# Patient Record
Sex: Female | Born: 1975 | Race: White | Hispanic: Yes | Marital: Single | Smoking: Never smoker
Health system: Southern US, Community
[De-identification: ages and names within clinical notes are randomized; demographics above are authoritative.]

## PROBLEM LIST (undated history)

## (undated) DIAGNOSIS — F419 Anxiety disorder, unspecified: Secondary | ICD-10-CM

## (undated) HISTORY — PX: NO PAST SURGERIES: SHX2092

## (undated) HISTORY — DX: Anxiety disorder, unspecified: F41.9

---

## 2005-10-12 ENCOUNTER — Ambulatory Visit: Payer: Self-pay | Admitting: *Deleted

## 2005-10-26 ENCOUNTER — Ambulatory Visit: Payer: Self-pay | Admitting: *Deleted

## 2005-11-30 ENCOUNTER — Ambulatory Visit: Payer: Self-pay | Admitting: *Deleted

## 2005-12-14 ENCOUNTER — Ambulatory Visit: Payer: Self-pay | Admitting: Family Medicine

## 2005-12-28 ENCOUNTER — Ambulatory Visit: Payer: Self-pay | Admitting: *Deleted

## 2006-01-04 ENCOUNTER — Ambulatory Visit: Payer: Self-pay | Admitting: Family Medicine

## 2006-01-12 ENCOUNTER — Ambulatory Visit: Payer: Self-pay | Admitting: Obstetrics & Gynecology

## 2006-01-19 ENCOUNTER — Ambulatory Visit: Payer: Self-pay | Admitting: Obstetrics & Gynecology

## 2006-01-23 ENCOUNTER — Inpatient Hospital Stay (HOSPITAL_COMMUNITY): Admission: AD | Admit: 2006-01-23 | Discharge: 2006-01-25 | Payer: Self-pay | Admitting: Gynecology

## 2006-01-23 ENCOUNTER — Ambulatory Visit: Payer: Self-pay | Admitting: Obstetrics and Gynecology

## 2006-06-09 ENCOUNTER — Ambulatory Visit: Payer: Self-pay | Admitting: Family Medicine

## 2006-07-21 ENCOUNTER — Ambulatory Visit: Payer: Self-pay | Admitting: Family Medicine

## 2007-05-25 ENCOUNTER — Ambulatory Visit: Payer: Self-pay | Admitting: Internal Medicine

## 2007-05-26 ENCOUNTER — Ambulatory Visit: Payer: Self-pay | Admitting: *Deleted

## 2007-07-26 ENCOUNTER — Encounter (INDEPENDENT_AMBULATORY_CARE_PROVIDER_SITE_OTHER): Payer: Self-pay | Admitting: *Deleted

## 2014-08-31 ENCOUNTER — Ambulatory Visit (INDEPENDENT_AMBULATORY_CARE_PROVIDER_SITE_OTHER): Payer: Self-pay | Admitting: Family Medicine

## 2014-08-31 VITALS — BP 114/68 | HR 88 | Temp 98.3°F | Resp 16 | Ht 59.75 in | Wt 119.8 lb

## 2014-08-31 DIAGNOSIS — N390 Urinary tract infection, site not specified: Secondary | ICD-10-CM

## 2014-08-31 DIAGNOSIS — Z3481 Encounter for supervision of other normal pregnancy, first trimester: Secondary | ICD-10-CM

## 2014-08-31 DIAGNOSIS — R109 Unspecified abdominal pain: Secondary | ICD-10-CM

## 2014-08-31 DIAGNOSIS — R8281 Pyuria: Secondary | ICD-10-CM

## 2014-08-31 DIAGNOSIS — Z3200 Encounter for pregnancy test, result unknown: Secondary | ICD-10-CM

## 2014-08-31 DIAGNOSIS — Z3201 Encounter for pregnancy test, result positive: Secondary | ICD-10-CM

## 2014-08-31 LAB — POCT URINALYSIS DIPSTICK
Bilirubin, UA: NEGATIVE
Blood, UA: NEGATIVE
Glucose, UA: NEGATIVE
Ketones, UA: NEGATIVE
Nitrite, UA: NEGATIVE
Protein, UA: NEGATIVE
Spec Grav, UA: 1.01
Urobilinogen, UA: 0.2
pH, UA: 6.5

## 2014-08-31 LAB — POCT URINE PREGNANCY: Preg Test, Ur: POSITIVE

## 2014-08-31 MED ORDER — NITROFURANTOIN MONOHYD MACRO 100 MG PO CAPS: 100.0000 mg | ORAL_CAPSULE | Freq: Two times a day (BID) | ORAL | Status: AC

## 2014-08-31 NOTE — Patient Instructions (Signed)
Embarazo e infección del tracto urinario °(Pregnancy and Urinary Tract Infection) °Una infección urinaria (IU) puede ocurrir en cualquier lugar del tracto urinario. La infección urinaria puede comprometer los utéteres, los riñones (pielonefritis), la vejiga (cistitis) y la uretra (uretritis). Todas las mujeres embarazadas deben ser estudiadas para diagnosticar la presencia de bacterias en el tracto urinario. La identificación y el tratamiento de una infección urinaria disminuye el riesgo de un parto prematuro y de desarrollar infecciones más graves en la madre y el bebé. °CAUSAS °Las bacterias causan casi todas las infecciones urinarias.  °FACTORES DE RIESGO °Hay muchos factores que pueden aumentar sus probabilidades de contraer una infección urinaria (IU) durante el embarazo. Pueden ser: °· Tener una uretra corta. °· Falta de aseo y malos hábitos de higiene. °· Las relaciones sexuales. °· Obstrucción de la orina en el tracto urinario. °· Problemas con los músculos o nervios pélvicos. °· Diabetes. °· Obesidad. °· Problemas en la vejiga después de tener varios hijos. °· Antecedentes de infección urinaria. °SIGNOS Y SÍNTOMAS  °· Dolor, ardor o sensación de ardor al orinar. °· Sentir la necesidad de orinar de inmediato (urgencia). °· Pérdida del control vesical (incontinencia urinaria). °· Orinar con más frecuencia de lo común en el embarazo. °· Malestar en la zona inferior del abdomen o en la espalda. °· Orina turbia. °· Sangre en la orina (hematuria). °· Fiebre.  °Cuando se infectan los riñones, los síntomas pueden ser: °· Dolor de espalda. °· Dolor lateral en el lado derecho más que en el lado izquierdo. °· Fiebre. °· Escalofríos. °· Náuseas. °· Vómitos. °DIAGNÓSTICO  °Una infección del tracto urinario se suele diagnosticar a través de la orina. A veces se realizan pruebas y procedimientos adicionales. Estos pueden ser: °· Ecografía de los riñones, los uréteres, la vejiga y la uretra. °· Observar la vejiga con un  tubo que ilumina (cistoscopía). °TRATAMIENTO °Por lo general, las IU pueden tratarse con medicamentos antibióticos.  °INSTRUCCIONES PARA EL CUIDADO EN EL HOGAR  °· Tome sólo medicamentos de venta libre o recetados, según las indicaciones del médico. Si le han recetado antibióticos, tómelos según las indicaciones. Tómelos todos, aunque se sienta mejor. °· Beba suficiente líquido para mantener la orina clara o de color amarillo pálido. °· No tenga relaciones sexuales hasta que la infección haya desaparecido o el médico la autorice. °· Asegúrese de que le hagan estudios para detectar una infección urinaria durante el embarazo. Estas infecciones suelen reaparecer.  °Para prevenir una infección urinaria en el futuro °· Practique buenos hábitos higiénicos. Siempre debe limpiarse desde adelante hacia atrás. Use el tissue sólo una vez. °· No retenga la orina. Orine tan pronto como sea posible cuando tenga ganas. °· No se haga duchas vaginales ni use desodorantes en aerosol. °· Lave con agua tibia y jabón alrededor de la zona genital y el ano. °· Vacíe la vejiga antes y después de tener relaciones sexuales. °· Use ropa interior con algodón en la entrepierna. °· Evite la cafeína y las bebidas gaseosas. Estas sustancias irritan la vejiga. °· Beba jugo de arándanos o tome comprimidos de arándano. Esto puede disminuir el riesgo de sufrir una infección urinaria. °· No beba alcohol. °· Cumpla con las visitas de control y hágase todos los análisis según lo programado.  °SOLICITE ATENCIÓN MÉDICA SI:  °· Los síntomas empeoran. °· Tiene fiebre aún después de 2 días de iniciado el tratamiento. °· Tiene una erupción. °· Siente que usted tiene problemas con los medicamentos recetados. °· Tiene flujo vaginal anormal. °SOLICITE ATENCIÓN MÉDICA DE INMEDIATO SI:  °·   Siente dolor en la espalda o a los lados. °· Tiene escalofríos. °· Observa sangre en la orina. °· Tiene náuseas o vómitos. °· Siente contracciones en el útero. °· Tiene una  perdida de líquido en chorro por la vagina. °ASEGÚRESE DE QUE: °· Comprende estas instrucciones.   °· Controlará su afección.   °· Recibirá ayuda de inmediato si no mejora o si empeora.   °Document Released: 07/19/2012 Document Revised: 08/15/2013 °ExitCare® Patient Information ©2015 [employer]. This information is not intended to replace advice given to you by your health care provider. Make sure you discuss any questions you have with your health care provider. ° °

## 2014-08-31 NOTE — Progress Notes (Signed)
Patient ID: Sally SkeansMaria D Phillips MRN: [id number], DOB: 07-16-1976, 38 y.o. Date of Encounter: 08/31/2014, 4:13 PM  This chart was scribed for Dr. Elvina SidleKurt Emma Birchler, MD by Jarvis Morganaylor Ferguson, Medical Scribe. This patient was seen in Room 2 and the patient's care was started at 4:15 PM.  Primary Physician: No primary provider on file.  Chief Complaint:  Chief Complaint  Patient presents with  . Pressure    Pt. feels pressure in her throat and chest. Has had the pressure for several months.   . Headache    2 days, Pt. states its more pressure than it is pain  . Back Pain  . Possible Pregnancy    Patient thinks she might be pregnant     HPI: 38 y.o. year old female with history below presents with gradually worsening trouble swallowing for several months. She notes it has gotten worse in the last week. Pt states that she has a sticky white phlegm and pressure feeling that has been making it difficult for her to swallow. She is having associated chest tightness and HAs. She states the HA feels more like pressure than it is pain. She denies any cough or shortness of breath.   Pt also thinks she may be pregnant. She was having associated pelvic cramps and back pain. Denies any vaginal bleeding. LNMP was 07/06/14.  Pt has been having a lot of increased anxiety in the past couple of months. She has a lot of responsibility of taking care of her family and children. She has been having associated panic attacks and trouble sleeping  Past Medical History  Diagnosis Date  . Anxiety      Home Meds: Prior to Admission medications   Not on File    Allergies: No Known Allergies  History   Social History  . Marital Status: Single    Spouse Name: N/A    Number of Children: N/A  . Years of Education: N/A   Occupational History  . Not on file.   Social History Main Topics  . Smoking status: Never Smoker   . Smokeless tobacco: Not on file  . Alcohol Use: No  . Drug Use: No  . Sexual Activity: Not  on file   Other Topics Concern  . Not on file   Social History Narrative  . No narrative on file     Review of Systems: Constitutional: negative for chills, fever, night sweats, weight changes, or fatigue  HEENT: positive for trouble swallowing. Negative for vision changes, hearing loss,  rhinorrhea, ST, epistaxis, congestion and sinus pressure Cardiovascular: negative for chest pain or palpitations Respiratory: Positive for chest tightness. negative for hemoptysis, wheezing, shortness of breath, or cough Abdominal: negative for abdominal pain, nausea, vomiting, diarrhea, or constipation Dermatological: negative for rash Neurologic: positive for headache. negative for dizziness, or syncope Musk: Positive for back pain Psychological: positive for anxiety and trouble sleeping GU: positive for pelvic pain and amenorrhea. Negative for vaginal bleeding All other systems reviewed and are otherwise negative with the exception to those above and in the HPI.   Physical Exam: Blood pressure 114/68, pulse 88, temperature 98.3 F (36.8 C), temperature source Oral, resp. rate 16, height 4' 11.75" (1.518 m), weight 119 lb 12.8 oz (54.341 kg), last menstrual period 07/05/2014, SpO2 99.00%., Body mass index is 23.58 kg/(m^2). General: Well developed, well nourished, in no acute distress. Head: Normocephalic, atraumatic, eyes without discharge, sclera non-icteric, nares are without discharge. Bilateral auditory canals clear, TM's are without perforation, pearly grey and  translucent with reflective cone of light bilaterally. Oral cavity moist, posterior pharynx without exudate, erythema, peritonsillar abscess, or post nasal drip.  Neck: Supple. No thyromegaly. Full ROM. No lymphadenopathy. Lungs: Clear bilaterally to auscultation without wheezes, rales, or rhonchi. Breathing is unlabored. Heart: RRR with S1 S2. No murmurs, rubs, or gallops appreciated. Abdomen: Soft, non-tender, non-distended with  normoactive bowel sounds. No hepatomegaly. No rebound/guarding. No obvious abdominal masses. Msk:  Strength and tone normal for age. Extremities/Skin: Warm and dry. No clubbing or cyanosis. No edema. No rashes or suspicious lesions. Neuro: Alert and oriented X 3. Moves all extremities spontaneously. Gait is normal. CNII-XII grossly in tact. Psych:  Responds to questions appropriately with a normal affect.   Labs: Results for orders placed in visit on 08/31/14  POCT URINE PREGNANCY      Result Value Ref Range   Preg Test, Ur Positive       ASSESSMENT AND PLAN:  38 y.o. year old female with pyuria, positive pregnancy test.   Possible pregnancy - Plan: POCT urine pregnancy, PR PRENATAL VITAMINS 30 DAY  Abdominal cramps - Plan: POCT urinalysis dipstick, Urine culture  Pyuria - Plan: Urine culture, nitrofurantoin, macrocrystal-monohydrate, (MACROBID) 100 MG capsule  Encounter for supervision of other normal pregnancy in first trimester - Plan: Ambulatory referral to Obstetrics / Gynecology    Signed, Elvina SidleKurt Atalia Litzinger, MD 08/31/2014 4:13 PM

## 2014-09-02 LAB — URINE CULTURE: Colony Count: 30000

## 2014-10-21 ENCOUNTER — Other Ambulatory Visit (HOSPITAL_COMMUNITY): Payer: Self-pay | Admitting: Nurse Practitioner

## 2014-10-21 DIAGNOSIS — Z3689 Encounter for other specified antenatal screening: Secondary | ICD-10-CM

## 2014-10-21 DIAGNOSIS — O09522 Supervision of elderly multigravida, second trimester: Secondary | ICD-10-CM

## 2014-10-24 ENCOUNTER — Encounter (HOSPITAL_COMMUNITY): Payer: Self-pay

## 2014-10-24 ENCOUNTER — Ambulatory Visit (HOSPITAL_COMMUNITY)
Admission: RE | Admit: 2014-10-24 | Discharge: 2014-10-24 | Disposition: A | Discharge status: Home / Self Care | Payer: Medicaid Other | Source: Ambulatory Visit | Attending: Nurse Practitioner | Admitting: Nurse Practitioner

## 2014-10-24 DIAGNOSIS — Z3A20 20 weeks gestation of pregnancy: Secondary | ICD-10-CM | POA: Insufficient documentation

## 2014-10-24 DIAGNOSIS — O09522 Supervision of elderly multigravida, second trimester: Secondary | ICD-10-CM | POA: Insufficient documentation

## 2014-10-24 DIAGNOSIS — Z36 Encounter for antenatal screening of mother: Secondary | ICD-10-CM | POA: Insufficient documentation

## 2014-10-24 DIAGNOSIS — Z3689 Encounter for other specified antenatal screening: Secondary | ICD-10-CM

## 2014-10-24 IMAGING — US US OB DETAIL+14 WK
1 series · 12 of 28 positions shown · non-contrast
Comparison: none

[Series 1: us ob detail +14 wk · 12 of 111 slices shown]
[im 5/111]
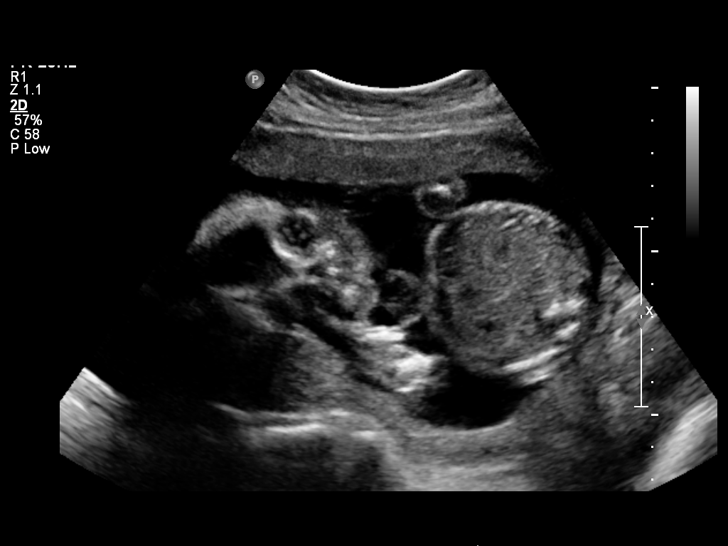
[im 13/111]
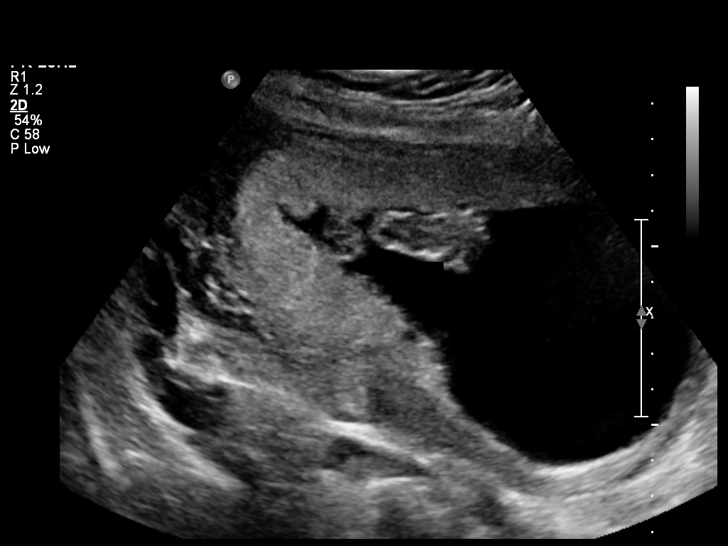
[im 21/111]
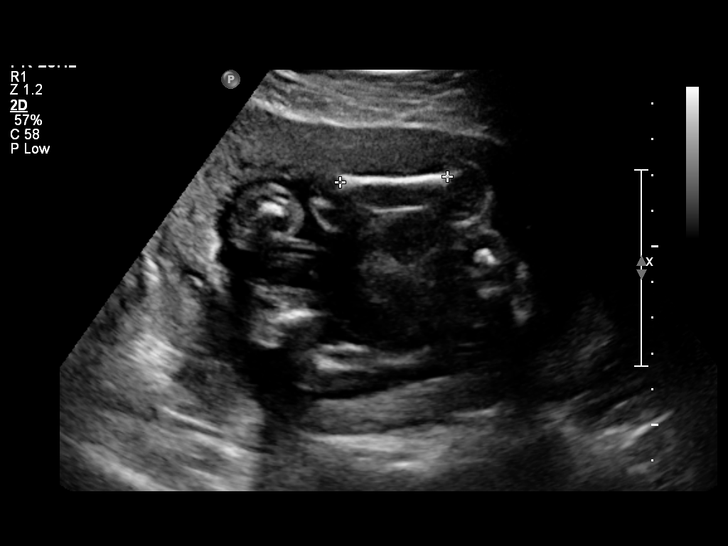
[im 33/111]
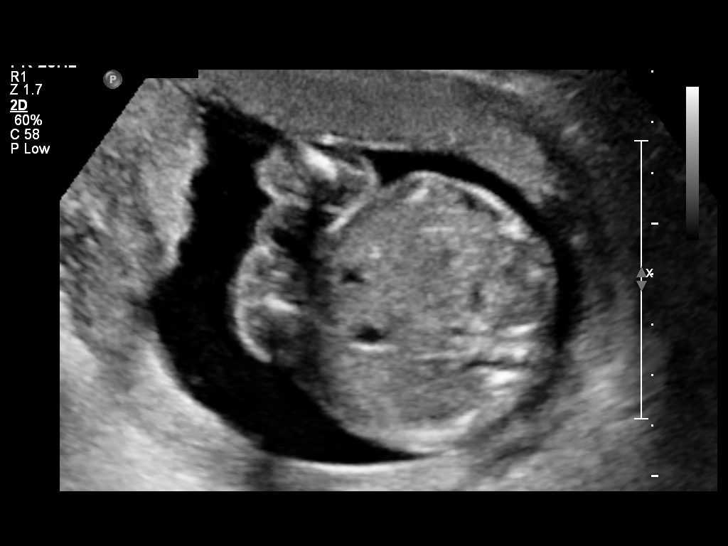
[im 41/111]
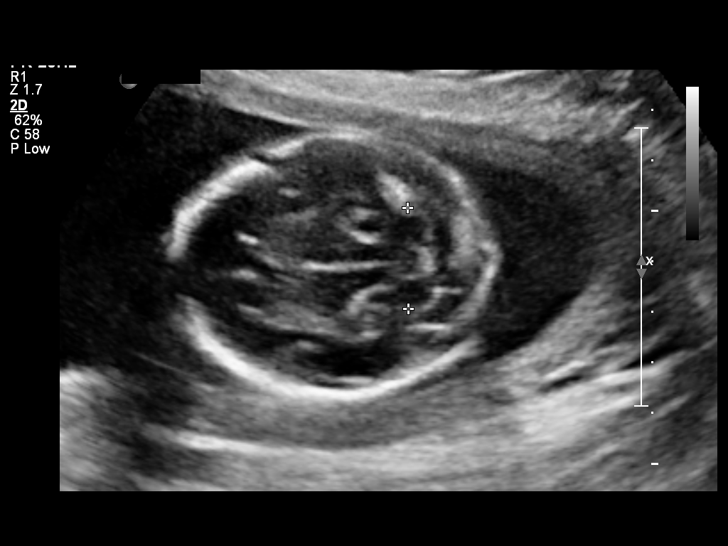
[im 49/111]
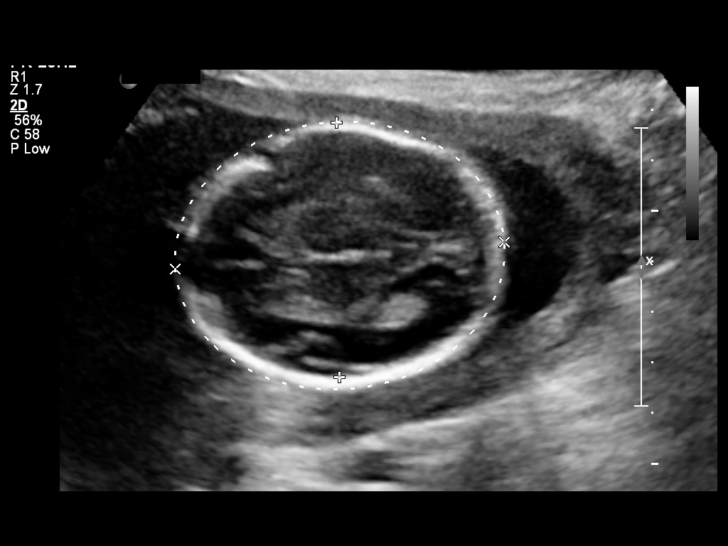
[im 62/111]
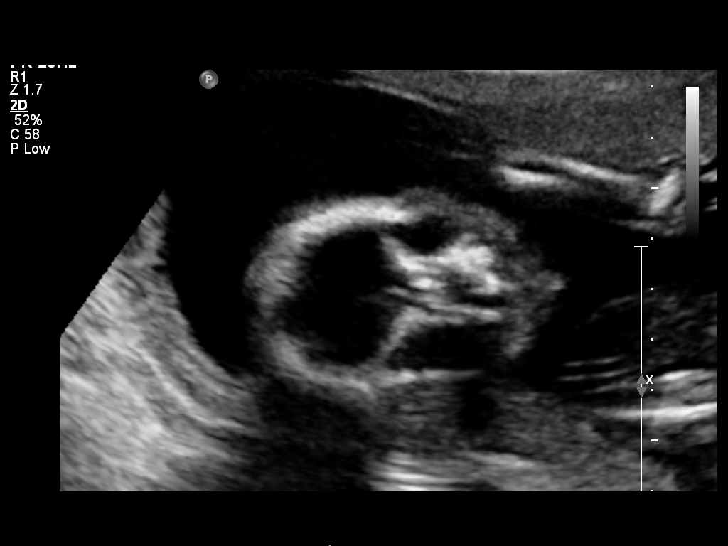
[im 70/111]
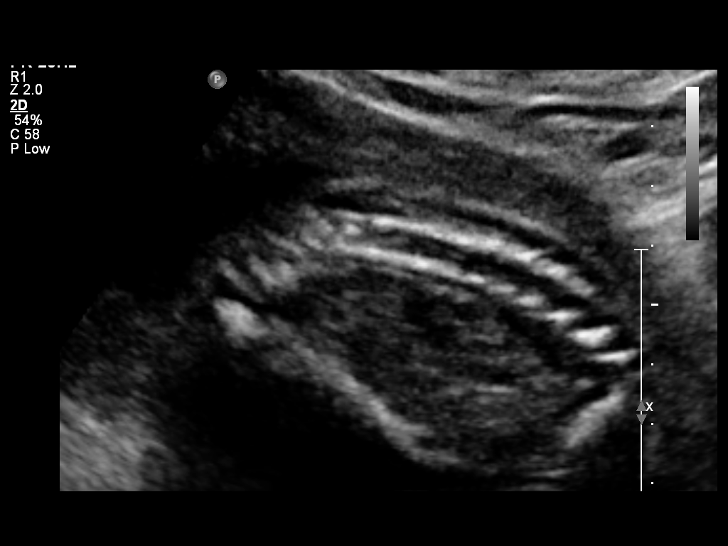
[im 78/111]
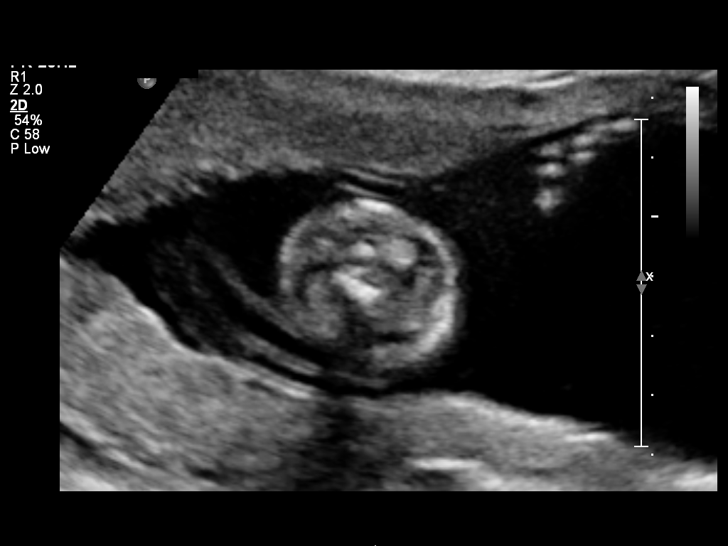
[im 90/111]
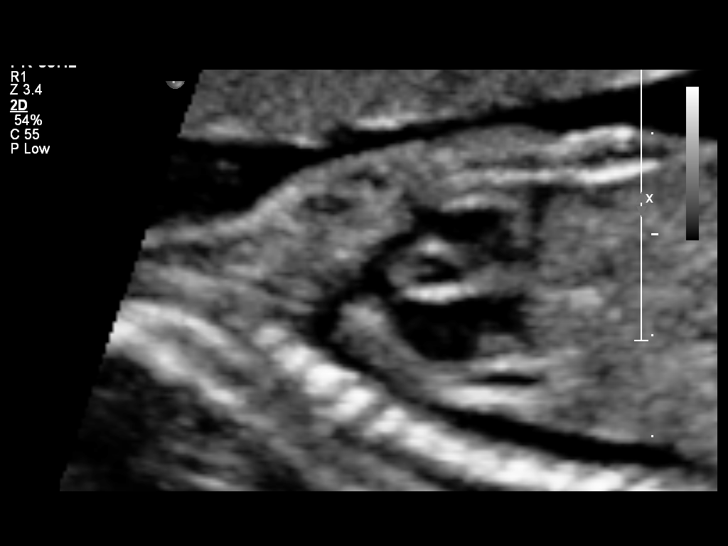
[im 98/111]
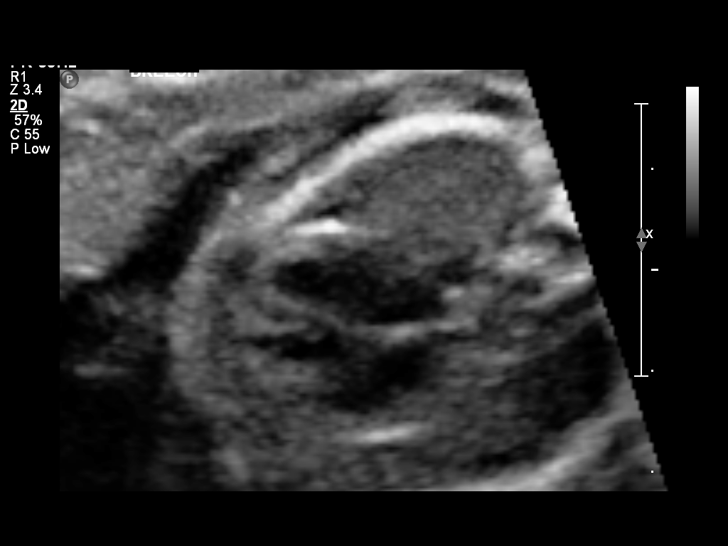
[im 106/111]
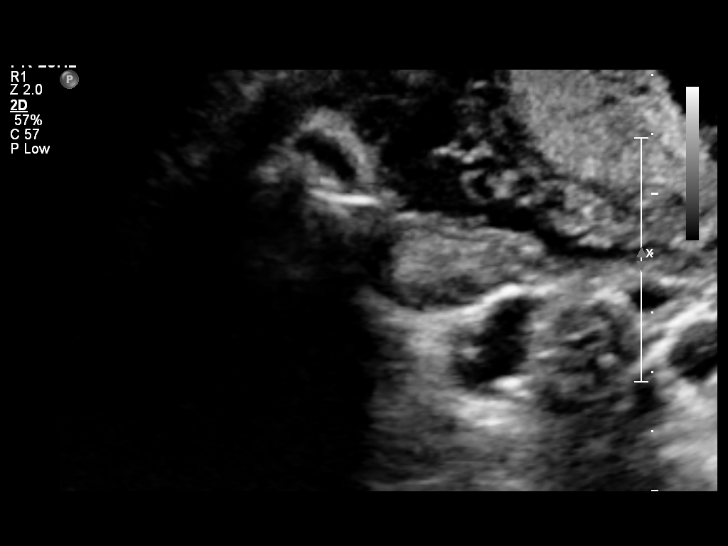

[12 of 28 positions shown; findings below may reference images not displayed]

OBSTETRICS REPORT
                      (Signed Final [DATE] [DATE])

Service(s) Provided

 US OB DETAIL + 14 WK                                  76811.0
Indications

 Detailed fetal anatomic survey                        Z36
 Advanced maternal age multigravida 35+, second        [7J]
 trimester
 20 weeks gestation of pregnancy
Fetal Evaluation

 Num Of Fetuses:    1
 Fetal Heart Rate:  152                          bpm
 Cardiac Activity:  Observed
 Presentation:      Breech
 Placenta:          Anterior RIGHT, above
                    cervical os
 P. Cord            Visualized, central
 Insertion:

 Amniotic Fluid
 AFI FV:      Subjectively within normal limits
                                              Larg Pckt:    5.8  cm
Biometry

 BPD:     50.5   mm    G. Age:  21w 2d                CI:        74.93    70 - 86
                                                      FL/HC:       18.2   16.8 -

 HC:     185.1   mm    G. Age:  20w 6d       74   %   HC/AC:       1.12   1.09 -

 AC:       166   mm    G. Age:  21w 4d       86   %   FL/BPD:
 FL:      33.7   mm    G. Age:  20w 4d       57   %   FL/AC:       20.3   20 - 24
 HUM:       31   mm    G. Age:  20w 2d       57   %
 CER:     19.9   mm    G. Age:  19w 0d       22   %

 Est. FW:     401   gm    0 lb 14 oz     62  %
Gestational Age

 LMP:           20w 1d        Date:  [DATE]                 EDD:    [DATE]
 U/S Today:     21w 1d                                        EDD:    [DATE]
 Best:          20w 1d     Det. By:  LMP  ([DATE])          EDD:    [DATE]
Anatomy

 Cranium:          Appears normal         Aortic Arch:       Appears normal
 Fetal Cavum:      Appears normal         Ductal Arch:       Appears normal
 Ventricles:       Appears normal         Diaphragm:         Appears normal
 Choroid Plexus:   Appears normal         Stomach:           Appears normal, left
                                                             sided
 Cerebellum:       Appears normal         Abdomen:           Appears normal
 Posterior Fossa:  Appears normal         Abdominal Wall:    Appears nml (cord
                                                             insert, abd wall)
 Nuchal Fold:      Not applicable (>20    Cord Vessels:      Appears normal (3
                   wks GA)                                   vessel cord)
 Face:             Appears normal         Kidneys:           Appear normal
                   (orbits and profile)
 Lips:             Appears normal         Bladder:           Appears normal
 Heart:            Appears normal         Spine:             Appears normal
                   (4CH, axis, and
                   situs)
 RVOT:             Appears normal         Lower              Appears normal
                                          Extremities:
 LVOT:             Appears normal         Upper              Appears normal
                                          Extremities:

 Other:  Nasal bone visualized. Heels visualized. Female gender. Hands not
         well seen.
Targeted Anatomy

 Fetal Central Nervous System
 Lat. Ventricles:  6.3                    Cisterna Magna:
Cervix Uterus Adnexa

 Cervical Length:    4.5       cm

 Cervix:       Normal appearance by transabdominal scan.
 Uterus:       No abnormality visualized.
 Cul De Sac:   No free fluid seen.
 Left Ovary:    Within normal limits.
 Right Ovary:   Within normal limits.
Impression

 SIUP at 20+1 weeks
 Normal detailed fetal anatomy
 Markers of aneuploidy: none
 Normal amniotic fluid volume
 Measurements consistent with LMP dating
Recommendations

 Follow-up ultrasounds as clinically indicated.

 questions or concerns.

## 2014-10-25 DIAGNOSIS — Z3689 Encounter for other specified antenatal screening: Secondary | ICD-10-CM | POA: Insufficient documentation

## 2014-10-25 DIAGNOSIS — O09529 Supervision of elderly multigravida, unspecified trimester: Secondary | ICD-10-CM | POA: Insufficient documentation

## 2014-10-25 DIAGNOSIS — Z3A20 20 weeks gestation of pregnancy: Secondary | ICD-10-CM | POA: Insufficient documentation

## 2014-11-08 NOTE — L&D Delivery Note (Signed)
Delivery Note  Pt came into MAU via cart from parking lot. Immediately upon arrival, at 5:07 PM a viable unspecified sex was delivered via Vaginal by RN, Spontaneous Delivery (Presentation:vertex).  APGAR: , ; weight  .   Placenta status: Intact, Spontaneous.  Cord: 3 vessels with the following complications: None. Anesthesia: None  Episiotomy: None Lacerations: 1st degree;Perineal, no repair needed Est. Blood Loss (mL):    Mom to postpartum.  Baby to Couplet care / Skin to Skin.  FRAZIER,NATALIE 03/08/2015, 5:40 PM   Precipitous delivery, this is an addendum to include APGARS and weight, I did not deliver baby nor was I present for delivery  APGAR: 8, 9; weight 6 lb 12.1 oz (3065 g).    Perry MountACOSTA,Nellie Pester ROCIO, MD 7:40 AM

## 2015-03-08 ENCOUNTER — Inpatient Hospital Stay (HOSPITAL_COMMUNITY)
Admission: AD | Admit: 2015-03-08 | Discharge: 2015-03-10 | DRG: 775 | Disposition: A | Discharge status: Home / Self Care | Payer: Medicaid Other | Source: Ambulatory Visit | Attending: Family Medicine | Admitting: Family Medicine

## 2015-03-08 ENCOUNTER — Inpatient Hospital Stay (HOSPITAL_COMMUNITY)
Admission: AD | Admit: 2015-03-08 | Discharge: 2015-03-08 | Disposition: A | Discharge status: Home / Self Care | Payer: Medicaid Other | Source: Ambulatory Visit | Attending: Obstetrics and Gynecology | Admitting: Obstetrics and Gynecology

## 2015-03-08 ENCOUNTER — Encounter (HOSPITAL_COMMUNITY): Payer: Self-pay | Admitting: *Deleted

## 2015-03-08 ENCOUNTER — Encounter (HOSPITAL_COMMUNITY): Payer: Self-pay

## 2015-03-08 DIAGNOSIS — Z3A39 39 weeks gestation of pregnancy: Secondary | ICD-10-CM | POA: Diagnosis not present

## 2015-03-08 DIAGNOSIS — O09523 Supervision of elderly multigravida, third trimester: Secondary | ICD-10-CM | POA: Diagnosis not present

## 2015-03-08 DIAGNOSIS — O9989 Other specified diseases and conditions complicating pregnancy, childbirth and the puerperium: Secondary | ICD-10-CM | POA: Diagnosis present

## 2015-03-08 DIAGNOSIS — Z8249 Family history of ischemic heart disease and other diseases of the circulatory system: Secondary | ICD-10-CM

## 2015-03-08 MED ORDER — IBUPROFEN 600 MG PO TABS
600.0000 mg | ORAL_TABLET | Freq: Four times a day (QID) | ORAL | Status: DC
  Administered 2015-03-09 – 2015-03-10 (×7): 600 mg via ORAL
  Filled 2015-03-08 (×5): qty 1

## 2015-03-08 MED ORDER — ZOLPIDEM TARTRATE 5 MG PO TABS: 5.0000 mg | ORAL_TABLET | Freq: Every evening | ORAL | Status: DC | PRN

## 2015-03-08 MED ORDER — ONDANSETRON HCL 4 MG/2ML IJ SOLN: 4.0000 mg | Freq: Four times a day (QID) | INTRAMUSCULAR | Status: DC | PRN

## 2015-03-08 MED ORDER — ONDANSETRON HCL 4 MG PO TABS: 4.0000 mg | ORAL_TABLET | ORAL | Status: DC | PRN

## 2015-03-08 MED ORDER — DIBUCAINE 1 % RE OINT: 1.0000 "application " | TOPICAL_OINTMENT | RECTAL | Status: DC | PRN

## 2015-03-08 MED ORDER — WITCH HAZEL-GLYCERIN EX PADS: 1.0000 "application " | MEDICATED_PAD | CUTANEOUS | Status: DC | PRN

## 2015-03-08 MED ORDER — BENZOCAINE-MENTHOL 20-0.5 % EX AERO: 1.0000 "application " | INHALATION_SPRAY | CUTANEOUS | Status: DC | PRN

## 2015-03-08 MED ORDER — OXYCODONE-ACETAMINOPHEN 5-325 MG PO TABS
1.0000 | ORAL_TABLET | ORAL | Status: DC | PRN
  Administered 2015-03-08: 1 via ORAL
  Filled 2015-03-08: qty 1

## 2015-03-08 MED ORDER — OXYCODONE-ACETAMINOPHEN 5-325 MG PO TABS: 2.0000 | ORAL_TABLET | ORAL | Status: DC | PRN

## 2015-03-08 MED ORDER — SODIUM CHLORIDE 0.9 % IJ SOLN: 3.0000 mL | Freq: Two times a day (BID) | INTRAMUSCULAR | Status: DC

## 2015-03-08 MED ORDER — OXYTOCIN 40 UNITS IN LACTATED RINGERS INFUSION - SIMPLE MED: 62.5000 mL/h | INTRAVENOUS | Status: DC

## 2015-03-08 MED ORDER — BENZOCAINE-MENTHOL 20-0.5 % EX AERO
1.0000 "application " | INHALATION_SPRAY | CUTANEOUS | Status: DC | PRN
  Administered 2015-03-08: 1 via TOPICAL
  Filled 2015-03-08: qty 56

## 2015-03-08 MED ORDER — DIPHENHYDRAMINE HCL 25 MG PO CAPS: 25.0000 mg | ORAL_CAPSULE | Freq: Four times a day (QID) | ORAL | Status: DC | PRN

## 2015-03-08 MED ORDER — TETANUS-DIPHTH-ACELL PERTUSSIS 5-2.5-18.5 LF-MCG/0.5 IM SUSP: 0.5000 mL | Freq: Once | INTRAMUSCULAR | Status: DC

## 2015-03-08 MED ORDER — CITRIC ACID-SODIUM CITRATE 334-500 MG/5ML PO SOLN: 30.0000 mL | ORAL | Status: DC | PRN

## 2015-03-08 MED ORDER — LACTATED RINGERS IV SOLN: 500.0000 mL | INTRAVENOUS | Status: DC | PRN

## 2015-03-08 MED ORDER — LIDOCAINE HCL (PF) 1 % IJ SOLN
30.0000 mL | INTRAMUSCULAR | Status: DC | PRN
  Filled 2015-03-08: qty 30

## 2015-03-08 MED ORDER — ONDANSETRON HCL 4 MG/2ML IJ SOLN: 4.0000 mg | INTRAMUSCULAR | Status: DC | PRN

## 2015-03-08 MED ORDER — LACTATED RINGERS IV SOLN: INTRAVENOUS | Status: DC

## 2015-03-08 MED ORDER — SODIUM CHLORIDE 0.9 % IV SOLN: 250.0000 mL | INTRAVENOUS | Status: DC | PRN

## 2015-03-08 MED ORDER — LANOLIN HYDROUS EX OINT: TOPICAL_OINTMENT | CUTANEOUS | Status: DC | PRN

## 2015-03-08 MED ORDER — OXYCODONE-ACETAMINOPHEN 5-325 MG PO TABS
2.0000 | ORAL_TABLET | ORAL | Status: DC | PRN
Start: 2015-03-08 — End: 2015-03-09

## 2015-03-08 MED ORDER — IBUPROFEN 600 MG PO TABS
600.0000 mg | ORAL_TABLET | Freq: Four times a day (QID) | ORAL | Status: DC
  Filled 2015-03-08: qty 1

## 2015-03-08 MED ORDER — ACETAMINOPHEN 325 MG PO TABS: 650.0000 mg | ORAL_TABLET | ORAL | Status: DC | PRN

## 2015-03-08 MED ORDER — SIMETHICONE 80 MG PO CHEW: 80.0000 mg | CHEWABLE_TABLET | ORAL | Status: DC | PRN

## 2015-03-08 MED ORDER — FLEET ENEMA 7-19 GM/118ML RE ENEM: 1.0000 | ENEMA | Freq: Every day | RECTAL | Status: DC | PRN

## 2015-03-08 MED ORDER — OXYCODONE-ACETAMINOPHEN 5-325 MG PO TABS: 1.0000 | ORAL_TABLET | ORAL | Status: DC | PRN

## 2015-03-08 MED ORDER — SENNOSIDES-DOCUSATE SODIUM 8.6-50 MG PO TABS
2.0000 | ORAL_TABLET | ORAL | Status: DC
  Filled 2015-03-08 (×2): qty 2

## 2015-03-08 MED ORDER — PRENATAL MULTIVITAMIN CH: 1.0000 | ORAL_TABLET | Freq: Every day | ORAL | Status: DC

## 2015-03-08 MED ORDER — BISACODYL 10 MG RE SUPP: 10.0000 mg | Freq: Every day | RECTAL | Status: DC | PRN

## 2015-03-08 MED ORDER — OXYTOCIN BOLUS FROM INFUSION: 500.0000 mL | INTRAVENOUS | Status: DC

## 2015-03-08 MED ORDER — PRENATAL MULTIVITAMIN CH
1.0000 | ORAL_TABLET | Freq: Every day | ORAL | Status: DC
  Administered 2015-03-09 – 2015-03-10 (×2): 1 via ORAL
  Filled 2015-03-08: qty 1

## 2015-03-08 MED ORDER — SODIUM CHLORIDE 0.9 % IJ SOLN: 3.0000 mL | INTRAMUSCULAR | Status: DC | PRN

## 2015-03-08 MED ORDER — SENNOSIDES-DOCUSATE SODIUM 8.6-50 MG PO TABS
2.0000 | ORAL_TABLET | ORAL | Status: DC
  Administered 2015-03-09 – 2015-03-10 (×2): 2 via ORAL
  Filled 2015-03-08: qty 2

## 2015-03-08 MED ORDER — SIMETHICONE 80 MG PO CHEW
80.0000 mg | CHEWABLE_TABLET | ORAL | Status: DC | PRN
  Filled 2015-03-08: qty 1

## 2015-03-08 NOTE — Progress Notes (Signed)
Checked on patients needs. Also, ordered patients dinner Spanish interpreter

## 2015-03-08 NOTE — Progress Notes (Signed)
Assisted Lactation with interpretation of breast feeding instructions.  ° °Spanish Interpreter  °

## 2015-03-08 NOTE — Discharge Instructions (Signed)
Tercer trimestre del embarazo  (Third Trimester of Pregnancy)  El tercer trimestre del embarazo abarca desde la semana 29 hasta la semana 42, desde el 7 mes hasta el 9. En este trimestre el beb (feto) se desarrolla muy rpidamente. Hacia el final del noveno mes, el beb que an no ha nacido mide alrededor de 20 pulgadas (45 cm) de largo. Y pesa entre 6 y 10 libras (2,700 y 4,500 kg).  CUIDADOS EN EL HOGAR   Evite fumar, consumir hierbas y beber alcohol. Evite los frmacos que no apruebe el mdico.  Slo tome los medicamentos que le haya indicado su mdico. Algunos medicamentos son seguros para tomar durante el embarazo y otros no lo son.  Haga ejercicios slo como le indique el mdico. Deje de hacer ejercicios si comienza a tener clicos.  Haga comidas regulares y sanas.  Use un sostn que le brinde buen soporte si sus mamas estn sensibles.  No utilice la baera con agua caliente, baos turcos y saunas.  Colquese el cinturn de seguridad cuando conduzca.  Evite comer carne cruda y el contacto con los utensilios y desperdicios de los gatos.  Tome las vitaminas indicadas para la etapa prenatal.  Trate de tomar medicamentos para mover el intestino (laxantes) segn lo necesario y si su mdico la autoriza. Consuma ms fibra comiendo frutas y vegetales frescos y granos enteros. Beba gran cantidad de lquido para mantener el pis (orina) de tono claro o amarillo plido.  Tome baos de agua tibia (baos de asiento) para calmar el dolor o las molestias causadas por las hemorroides. Use una crema para las hemorroides si el mdico la autoriza.  Si tiene venas hinchadas y abultadas (venas varicosas), use medias de soporte. Eleve (levante) los pies durante 15 minutos, 3 o 4 veces por da. Limite el consumo de sal en su dieta.  Evite levantar objetos pesados, usar tacones altos y sintese derecha.  Descanse con las piernas elevadas si tiene calambres o dolor de cintura.  Visite a su dentista  si no lo ha hecho durante el embarazo. Use un cepillo de dientes blando para higienizarse los dientes. Use suavemente el hilo dental.  Puede tener sexo (relaciones sexuales) siempre que el mdico la autorice.  No viaje por largas distancias si puede evitarlo. Slo hgalo con la aprobacin de su mdico.  Haga el curso pre parto.  Practique conducir hasta el hospital.  Prepare el bolso que llevar.  Prepare la habitacin del beb.  Concurra a los controles mdicos. SOLICITE AYUDA SI:   No est segura si est en trabajo de parto o ha roto la bolsa de aguas.  Tiene mareos.  Siente clicos intensos o presin en la zona baja del vientre (abdomen).  Siente un dolor persistente en la zona del vientre.  Tiene malestar estomacal (nuseas), devuelve (vomita), o tiene deposiciones acuosas (diarrea).  Advierte un olor ftido que proviene de la vagina.  Siente dolor al hacer pis (orinar). SOLICITE AYUDA DE INMEDIATO SI:   Tiene fiebre.  Pierde lquido o sangre por la vagina.  Tiene sangrando o pequeas prdidas vaginales.  Siente dolor intenso o clicos en el abdomen.  Sube o baja de peso rpidamente.  Tiene dificultad para respirar o siente dolor en el pecho.  Sbitamente se le hinchan el rostro, las manos, los tobillos, los pies o las piernas.  No ha sentido los movimientos del beb durante una hora.  Siente un dolor de cabeza intenso que no se alivia con medicamentos.  Su visin se modifica. Document   Released: 06/27/2013 Exit[employer] Patient Information 2015 [employer]. This information is not intended to replace advice given to you by your health care provider. Make sure you discuss any questions you have with your health care provider.  

## 2015-03-08 NOTE — H&P (Signed)
Sally SkeansMaria D Phillips is a 39 y.o. female presenting for Labor. She presented from the parking lot in advanced labor. Passersby offered her a cart and her husband placed her on the cart. At that time, the baby delivered precipitously.  Georges MouseNatalie Frazier and MAU staff attended to patient immediately after.  Maternal Medical History:  Reason for admission: Rupture of membranes and contractions.  Nausea.  Contractions: Onset was 6-12 hours ago.   Frequency: regular.   Perceived severity is strong.    Fetal activity: Perceived fetal activity is normal.   Last perceived fetal movement was within the past hour.    Prenatal complications: No bleeding, HIV, PIH or preterm labor.   Prenatal Complications - Diabetes: none.    OB History    Gravida Para Term Preterm AB TAB SAB Ectopic Multiple Living   4 4 4  0 0 0 0 0 0 4     Past Medical History  Diagnosis Date  . Anxiety    Past Surgical History  Procedure Laterality Date  . No past surgeries     Family History: family history includes Hypertension in her mother. There is no history of Hearing loss. Social History:  reports that she has never smoked. She has never used smokeless tobacco. She reports that she does not drink alcohol or use illicit drugs.    Review of Systems  Constitutional: Negative for fever, chills and malaise/fatigue.  Gastrointestinal: Positive for abdominal pain. Negative for nausea, vomiting, diarrhea and constipation.  Neurological: Negative for dizziness and headaches.      Blood pressure 111/58, pulse 66, temperature 97.7 F (36.5 C), temperature source Oral, resp. rate 16, last menstrual period 06/05/2014, unknown if currently breastfeeding. Maternal Exam:  Uterine Assessment: Contraction strength is firm.  Contraction frequency is regular.   Abdomen: Patient reports no abdominal tenderness. Fetal presentation: vertex  Introitus: Normal vulva. Normal vagina.  Ferning test: not done.  Nitrazine test: not  done. Amniotic fluid character: clear.  Pelvis: adequate for delivery.   Cervix: Cervix evaluated by digital exam.     Fetal Exam Fetal Monitor Review: Baseline rate: Unable to monitor as baby was delivering.      Physical Exam  Constitutional: She is oriented to person, place, and time. She appears well-developed and well-nourished. No distress.  HENT:  Head: Normocephalic.  Cardiovascular: Normal rate and regular rhythm.   Respiratory: Effort normal and breath sounds normal. No respiratory distress.  GI: Soft. There is no tenderness. There is no rebound and no guarding.  Genitourinary: Vagina normal.  Baby found delivered by Telford NabN Frazier CNM  Musculoskeletal: Normal range of motion.  Neurological: She is alert and oriented to person, place, and time.  Skin: Skin is warm and dry.  Psychiatric: She has a normal mood and affect.    Prenatal labs: ABO, Rh:   Antibody:   Rubella:   RPR:    HBsAg:    HIV:    GBS:     Assessment/Plan: A:  SIUP at 122w3d       Active labor with precipitous delivery  P:  Admit to Postpartum       Routine orders          [facility] CenterWILLIAMS,Dariel Pellecchia 03/08/2015, 6:58 PM

## 2015-03-08 NOTE — Progress Notes (Signed)
Assisted RN with interpretation of patient assessments Spanish interpreter

## 2015-03-08 NOTE — MAU Note (Signed)
Pt ambulated to bathroom and voided

## 2015-03-08 NOTE — MAU Note (Signed)
Pt presented to MAU front desk on top of a rolling cart with her husband and two men who stated they found them in the parking lot needing help. Pt rolled back to room 4 and before we were able to move her off the cart and onto the bed, I can see that the baby's head is between the pt's legs, still inside her pants. Pants were removed and infant was delivered without trauma. Vigorous cry immediately. Telford NabN. Frazier CNM at bedside to clamp and cut cord. Baby moved to warmer and mother moved off cart onto bed. Telford NabN. Frazier delivered placenta. Baby to skin to skin

## 2015-03-08 NOTE — Progress Notes (Signed)
Assisted RN and staff with interpretation of postdelivery assessments Spanish Interpreter

## 2015-03-08 NOTE — MAU Note (Signed)
Goes to health dept.  Having ctxs every 5 min at home since 11 pm and here they are every 15 min.  Baby moving well.  Bloody mucus.  2 cm at office.  No leaking.

## 2015-03-09 DIAGNOSIS — Z8249 Family history of ischemic heart disease and other diseases of the circulatory system: Secondary | ICD-10-CM

## 2015-03-09 DIAGNOSIS — O09523 Supervision of elderly multigravida, third trimester: Secondary | ICD-10-CM

## 2015-03-09 DIAGNOSIS — Z3A39 39 weeks gestation of pregnancy: Secondary | ICD-10-CM

## 2015-03-09 LAB — CBC
HEMATOCRIT: 36.7 % (ref 36.0–46.0)
HEMOGLOBIN: 13.2 g/dL (ref 12.0–15.0)
MCH: 33 pg (ref 26.0–34.0)
MCHC: 36 g/dL (ref 30.0–36.0)
MCV: 91.8 fL (ref 78.0–100.0)
Platelets: 189 10*3/uL (ref 150–400)
RBC: 4 MIL/uL (ref 3.87–5.11)
RDW: 12.7 % (ref 11.5–15.5)
WBC: 14.5 10*3/uL — AB (ref 4.0–10.5)

## 2015-03-09 LAB — TYPE AND SCREEN
ABO/RH(D): O POS
ANTIBODY SCREEN: NEGATIVE

## 2015-03-09 LAB — ABO/RH: ABO/RH(D): O POS

## 2015-03-09 LAB — HIV ANTIBODY (ROUTINE TESTING W REFLEX): HIV SCREEN 4TH GENERATION: NONREACTIVE

## 2015-03-09 LAB — RPR: RPR: NONREACTIVE

## 2015-03-09 NOTE — Progress Notes (Signed)
Post Partum Day 1 Subjective:  Sally Phillips is a 39 y.o. [id number] 6079w3d s/p SVD.  No acute events overnight.  Pt denies problems with ambulating, voiding or po intake.  She denies nausea or vomiting.  Pain is well controlled.  She has had flatus. She has not had bowel movement.  Lochia Small.  Plan for birth control is condoms.  Method of Feeding: Breast/Bottle  Objective: Blood pressure 91/51, pulse 65, temperature 98 F (36.7 C), temperature source Oral, resp. rate 18, last menstrual period 06/05/2014, unknown if currently breastfeeding.  Physical Exam:  General: alert, cooperative and no distress Lochia:normal flow Chest: CTAB, no increased WOB Heart: RRR, +2DP Abdomen: +BS, soft, nontender Uterine Fundus: firm DVT Evaluation: No evidence of DVT seen on physical exam. Extremities: WWP, trace edema   Recent Labs  03/08/15 2045  HGB 13.2  HCT 36.7    Assessment/Plan:  ASSESSMENT: Sally Phillips is a 39 y.o. [id number] 6179w3d s/p SVD  Plan for discharge tomorrow and Breastfeeding   LOS: 1 day   Delynn FlavinGottschalk, Ashly M, DO 03/09/2015, 7:42 AM  D Jamielee Mchale cnm

## 2015-03-09 NOTE — Progress Notes (Signed)
Assisted  with interpretation of baby assessment and was present for PKU.   Spanish Interpreter Sally Phillips

## 2015-03-09 NOTE — Progress Notes (Signed)
Assisted RN with interpretation of upcoming baby labs.  Also present for baby assessment.   Spanish Interpreter Joselyn GlassmanBenita Sanchez

## 2015-03-09 NOTE — Progress Notes (Signed)
Checked on patients needs.  Also assisted RN with Interpretation of education materials. °Spanish interpreter Benita Sanchez °

## 2015-03-09 NOTE — Progress Notes (Signed)
Checked on patients needs also ordered patients meals.  Spanish Interpreter Joselyn GlassmanBenita Sanchez

## 2015-03-09 NOTE — Lactation Note (Signed)
This note was copied from the chart of Sally Demetra ShinerMaria Teall. Lactation Consultation Note; Mom reports that is latching well with no pain- just finished feeding and is asleep in mom's arms.. Reports she is having some trouble with getting wide open mouth, encouraged to stroke nipple across lips and wait until she opens wide. She is experienced BF mom. No questions at present. To call for assist prn  Patient Name: Sally Phillips ZOXWR'UToday's Date: 03/09/2015 Reason for consult: Follow-up assessment   Maternal Data Formula Feeding for Exclusion: No Does the patient have breastfeeding experience prior to this delivery?: Yes  Feeding    LATCH Score/Interventions                      Lactation Tools Discussed/Used     Consult Status Consult Status: Follow-up Date: 03/10/15 Follow-up type: In-patient    Pamelia HoitWeeks, Jenyfer Trawick D 03/09/2015, 2:51 PM

## 2015-03-09 NOTE — Progress Notes (Signed)
Used Docia ChuckJuana Gonzalez, NT for interpreting for 11p-7a shift

## 2015-03-10 MED ORDER — IBUPROFEN 600 MG PO TABS: 600.0000 mg | ORAL_TABLET | Freq: Four times a day (QID) | ORAL | Status: AC

## 2015-03-10 NOTE — Progress Notes (Signed)
Ur chart review completed.  

## 2015-03-10 NOTE — Lactation Note (Signed)
This note was copied from the chart of Sally Phillips. Lactation Consultation Note  Patient Name: Sally Phillips WUJWJ'XToday's Date: 03/10/2015 Reason for consult: Follow-up assessment   with this mom and term baby, now 2342 hours old. Mom has sore nipples.Given comfort gels and instructed in their use. On exam, the baby can not pull my finger into her mouth, and her tongue slips behind her gum line. 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 I showed mom with her doing finger sucking with the baby, what she is doing.Mom seemed to understand. I encouraged lots of skin to skin and tummy time, in hopes of increasing her tongue movement in time.  Mom is using a nipple shield, and with multiple attempts, I had mom learn to advance the baby deep on the shield, for a comfortable latch. [facility]. Mom went home with a manual hand pump, and I showed her how to use this, and how to do hand expression. Mom has an o/p lactation appointment for 5/5 at 1030 am.    Maternal Data    Feeding Feeding Type: Breast Fed Length of feed: 15 min (baby on and off for next hour)  LATCH Score/Interventions Latch: Repeated attempts needed to sustain latch, nipple held in mouth throughout feeding, stimulation needed to elicit sucking reflex. (I showed mom how to obtain a deep latch, beyond nipple with shiled) Intervention(s): Adjust position;Assist with latch  Audible Swallowing: A few with stimulation (mom has easily expressed colostrum - shiled half filled with hand expression) Intervention(s): Skin to skin;Hand expression  Type of Nipple: Everted at rest and after stimulation (everted and some inversion)  Comfort (Breast/Nipple): Filling, red/small blisters or bruises, mild/mod discomfort  Problem noted: Mild/Moderate discomfort;Cracked, bleeding, blisters, bruises (nipples red, raw, not bleeding, but tender) Interventions   (Cracked/bleeding/bruising/blister): Expressed breast milk to nipple;Hand pump Interventions (Mild/moderate discomfort): Comfort gels  Hold (Positioning): Assistance needed to correctly position infant at breast and maintain latch. Intervention(s): Breastfeeding basics reviewed;Support Pillows;Position options;Skin to skin  LATCH Score: 6  Lactation Tools Discussed/Used WIC Program: Yes ([facility], mom went home with hand pump, and instructed in it's use)   Consult Status Consult Status: Follow-up Date: 03/13/15 Follow-up type: Out-patient    Alfred LevinsLee, Shell Blanchette Anne 03/10/2015, 12:13 PM

## 2015-03-10 NOTE — Lactation Note (Signed)
This note was copied from the chart of Sally Demetra ShinerMaria Daubert. Lactation Consultation Note RN stated mom having difficulty latching baby and had 8% weight loss. Mom has inverted nipples, baby unable to obtain a deep latch. Has a recessed chin, upper labial lip frenulum, tongue curls on sides when cries. Spanish Interpreter at bedside for teaching. Mom has great flow of colostrum when hand expressed. Expressed 20ml and gave in curve tip syring and sucked on glove finger. Noted baby doesn't put tongue under finger and pull as suckles. Fitted mom w/#20 NS slightly tight, #24 NS fit better, expressed colostrum into NS. Gone after baby BF. Latched in football position, baby BF great. Taught chin tug. Mom stated more comfortable. Taught breast massage and expression. Shells given to wear in am between BF in Bra to evert nipple. Has hand pump, encouraged to use prior to appyling NS to define nipple boarders.  Mom states she understands application of NS and monitoring I&O of baby. Baby has had 9 voids and 8 stools since birth in 32 hours of life which could have an effect of increase weight loss.  Patient Name: Sally Phillips Today's Date: 03/10/2015 Reason for consult: Initial assessment   Maternal Data Has patient been taught Hand Expression?: Yes  Feeding Feeding Type: Breast Milk Length of feed: 25 min  LATCH Score/Interventions Latch: Grasps breast easily, tongue down, lips flanged, rhythmical sucking. Intervention(s): Adjust position;Assist with latch;Breast massage;Breast compression  Audible Swallowing: Spontaneous and intermittent  Type of Nipple: Inverted Intervention(s): Shells;Hand pump  Comfort (Breast/Nipple): Soft / non-tender     Hold (Positioning): Assistance needed to correctly position infant at breast and maintain latch. Intervention(s): Breastfeeding basics reviewed;Support Pillows;Position options;Skin to skin  LATCH Score: 7  Lactation Tools Discussed/Used Tools:  Shells;Nipple Dorris CarnesShields;Pump Nipple shield size: 24 Shell Type: Inverted Breast pump type: Manual Pump Review: Setup, frequency, and cleaning;Milk Storage Initiated by:: Peri JeffersonL. Lashun Mccants RN Date initiated:: 03/09/15   Consult Status Consult Status: Follow-up Date: 03/10/15 (in pm) Follow-up type: In-patient    Rolena Knutson, Diamond NickelLAURA G 03/10/2015, 3:00 AM

## 2015-03-10 NOTE — Discharge Summary (Signed)
Obstetric Discharge Summary Reason for Admission: onset of labor Prenatal Procedures: ultrasound Intrapartum Procedures: spontaneous vaginal delivery Postpartum Procedures: none Complications-Operative and Postpartum: none HEMOGLOBIN  Date Value Ref Range Status  03/08/2015 13.2 12.0 - 15.0 g/dL Final   HCT  Date Value Ref Range Status  03/08/2015 36.7 36.0 - 46.0 % Final    Physical Exam:  General: alert, cooperative and no distress Lochia: appropriate Uterine Fundus: firm Incision: n/a DVT Evaluation: No evidence of DVT seen on physical exam. Negative Homan's sign. No cords or calf tenderness. No significant calf/ankle edema.  Discharge Diagnoses: Term Pregnancy-delivered  Discharge Information: Date: 03/10/2015 Activity: pelvic rest Diet: routine Medications: PNV, Ibuprofen and Percocet Condition: stable and improved Instructions: refer to practice specific booklet Discharge to: home   Newborn Data: Live born female  Birth Weight: 6 lb 12.1 oz (3065 g) APGAR: 8, 9  Home with mother.  Wyvonnia DuskyLAWSON, Sally Quarry DARLENE 03/10/2015, 7:19 AM

## 2015-03-13 ENCOUNTER — Ambulatory Visit (HOSPITAL_COMMUNITY)
Admit: 2015-03-13 | Discharge: 2015-03-13 | Disposition: A | Discharge status: Home / Self Care | Payer: Medicaid Other | Attending: Obstetrics and Gynecology | Admitting: Obstetrics and Gynecology

## 2015-03-13 NOTE — Lactation Note (Signed)
Lactation Consult  Mother's reason for visit: follow up feeding assistance Visit Type: feeding assessment  Appointment Notes:  Mother was scheduled for outpatient visit due to poor latch. [place] Interpreter # [phone] on the phone for interpretation of all teaching , questions and plan of care.  Both parents understand english well, but have difficulty speaking english. Mother is also able to read english.   Mother was using a Nipple shield, due to inverted nipples.   Mother states that she breastfed her other children with similar problems. She states she had to given her boys formula until her milk came in,. She states that her infant has not had any formula . She has only been breastfeeding and giving breastmilk with a bottle.  Mothers nipples are cracked with scabbing.  Consult:  Initial Lactation Consultant:  Michel BickersKendrick, Toccara Alford McCoy  ________________________________________________________________________  Joan FloresBaby's Name: Loretha Stapleraniela Sanchez Hixon Date of Birth: 03/08/2013 Pediatrician: Manson PasseyBrown Gender: female Gestational Age: 2623w3d (At Birth) Birth Weight: 6 lb 12.1 oz (3065 g) Weight at Discharge: Weight: 6 lb 3.5 oz (2820 g)Date [facility]: [facility]   03/08/15 1707 03/09/15 0031 03/09/15 2334  Weight: 6 lb 12.1 oz (3065 g) 6 lb 8.9 oz (2975 g) 6 lb 3.5 oz (2820 g)   [facility]: 6-5.9, 2888     Admission Information     Provider Service Admission Date    Jonetta OsgoodKirsten Brown, MD Newborn 03/08/2013          ADT Events       Unit Room Bed Service Event    03/08/15 1707 [facility] [id number] Newborn Admission    03/08/15 1745 [facility] 9197 [id number] Newborn Transfer Out    03/08/15 1745 [facility] 9150 [id number] Newborn Transfer In    03/10/15 1433 [facility] 9150 [id number] Newborn Discharge      Weight Information (last 3 days) before discharge     Date/Time   Weight   BSA (Calculated - sq m)   BMI (Calculated) Who      03/09/15 2334  6 lb 3.5 oz (2820 g)  --  -- OB     03/09/15 0031  6 lb 8.9 oz (2975 g)  --  -- Ottis StainJG     03/08/15 1707  6 lb 12.1 oz (3065 g)  --  -- MH           Weights    Go to now       03/06/15 - Today         One Day Eight Hours  View All    04/30 - 05/01 05/01   Time:  1707 0031 2334    Weight   Weight  6 lb 1... 6 lb 8... 6 lb 3...  Weight               ________________________________________________________________________  Mother's Name: Georganna SkeansMaria D Bebout Type of delivery:  vaginal delivery Breastfeeding Experience:  Mother breastfed 3 other children for 6-8 months each Maternal Medical Conditions:  none Maternal Medications:  Prenatal Vits, Motrin  ________________________________________________________________________  Breastfeeding History (Post Discharge)  Frequency of breastfeeding: Mother attempting but states her nipple is too big and cant get her infant latched Duration of feeding:  none  Supplementation    Breastmilk: 30ml every 2-3 hours   Method:  Bottle,   Pumping  Type of pump:  Lactina, [facility], mother has pumped 3 times Frequency:  3 times Volume:  30 ml with each  pumping  Infant Intake and Output Assessment  Voids:  5 n 24 hrs.  Color:  Clear yellow Stools: 5 in 24 hrs.  Color:  Yellow  ________________________________________________________________________  Maternal Breast Assessment  Breast:  Full Nipple:  Inverted Pain level:  3 Pain interventions:  Bra  _______________________________________________________________________  Initial feeding assessment:Infant was fed 30ml one hour piror to consult. Several attempts to latch infant with and without a nipple shield. Infant was given 10 ml of ebm with a curved tip syringe through the nipple shield. Still only a few sucks and few swallows.  Infant very sleepy and not interested in  feeding.   Assist mother with post pumping using her Lactina pump for 20 mins. Mother obtained 90 ml of ebm. Infant was given additional 25 ml ebm with a bottle.   Infant's oral assessment:  Variance  Positioning:  Cross cradle Right breast  LATCH documentation:  Latch:  1 = Repeated attempts needed to sustain latch, nipple held in mouth throughout feeding, stimulation needed to elicit sucking reflex.  Audible swallowing:  0 = None  Type of nipple:  0 = Inverted  Comfort (Breast/Nipple):  0 = Engorged, cracked, bleeding, large blisters, severe discomfort  Hold (Positioning):  1 = Assistance needed to correctly position infant at breast and maintain latch  LATCH score:  2  Attached assessment:  Shallow  Lips flanged:  No.  Lips untucked:  Yes.     Pre-feed weight:  2888 g  (6 lb. 5.9 oz.)    Total amount pumped post feed:  R 50  ml    L 40 ml Total supplement given:  3735  Ml  Mother advised to continue to offer breast with nipple shield before bottle feeding then,   Mother to supplement infant with bottle of EBM 30-45 ml every 2-3 hours  Mother to continue to post pump for 15-20 mins.  Advised mother to follow up with Peds in am at 9;00 [facility] 10;30 Parents receptive to all teaching and understands plan well. Written plan given. Mother was able to read. Also interpreted

## 2015-03-20 ENCOUNTER — Ambulatory Visit (HOSPITAL_COMMUNITY)
Admission: RE | Admit: 2015-03-20 | Discharge: 2015-03-20 | Disposition: A | Discharge status: Home / Self Care | Payer: Medicaid Other | Source: Ambulatory Visit | Attending: Family Medicine | Admitting: Family Medicine

## 2015-03-20 NOTE — Lactation Note (Signed)
Lactation Consult  Mother's reason for visit:  Feeding difficulty Visit Type: feeding assessment Appointment Notes: Spanish Interpreter Elane Fritz(Blanca) here for consult .  Mother started having began to have nipple pain 2-3 days ago and is now pumping only. Observed that mother has bilateral cracking. Mother is using comfort gels. Mother has bilateral inverted nipples.   When taking history from Mother, Mother began to cry and states that she feels overwhelmed with all her household chores. Mother has a 37 year old, 1210 and 37 year old. She states she has become emotional . She states that she was like this with all other children. Mother states she didn't take any medicine for depression. She states after a few weeks she began to feel better. She was ask when she plans to visit her OB Dr. She stated that she didn't have an appt. [facility]. Mother was given the phone number and advised to phone for an appt. Advised mother to inform OB of emotional status and schedule a follow up visit soon. Reviewed S/S of PP depression with mother.  Mother states that pumping maybe easier for her although she states that breastfeeding is her goal.  Consult:  Follow up  Lactation Consultant:  Michel BickersKendrick, Bowen Goyal McCoy  ________________________________________________________________________    ________________________________________________________________________  Mother's Name: Georganna SkeansMaria D Dauenhauer Type of delivery:  vaginal del Breastfeeding Experience:  breastfed other 3 other children for 8 - 9 months Maternal Medical Conditions:  none Maternal Medications: Prenaltal  ________________________________________________________________________  Breastfeeding History (Post Discharge)  Frequency of breastfeeding:  Every 2-3 hours until 2-3 days ago. Mother began to pump and bottle feed Duration of feeding:      Pumping  [facility] Frequency:  Every 2-3 hours for 15-20 mins Volume:  60-80 ml  Infant Intake and Output Assessment  Voids:7  in 24 hrs.  Color:  Clear yellow Stools:  4 in 24 hrs.  Color:  Yellow  ________________________________________________________________________  Maternal Breast Assessment  Breast:  Full Nipple:  Inverted Pain level:  0 Pain interventions:  Bra, Expressed breast milk and Hydrogel dressing  _______________________________________________________________________ Feeding Assessment/Evaluation  Initial feeding assessment:  Infant's oral assessment:  WNL  Positioning:  Cross cradle Left breast  LATCH documentation:  Latch:  2 = Grasps breast easily, tongue down, lips flanged, rhythmical sucking.  Audible swallowing:  2 = Spontaneous and intermittent  Type of nipple:  inverted  Comfort (Breast/Nipple):  1 = Filling, red/small blisters or bruises, mild/mod discomfort  Hold (Positioning):  1 = Assistance needed to correctly position infant at breast and maintain latch  LATCH score:  7  Attached assessment:  Shallow  Lips flanged:  Yes.    Lips untucked:  Yes.    Suck assessment:  Nutritive and Nonnutritive  Tools:  Nipple shield 24 mm Instructed on use and cleaning of tool:  Yes.    Pre-feed weight:  2978 g   Post-feed weight:  2996 g  Amount transferred:  18 ml  Additional Feeding Assessment -   Mother's left nipple inverts more than the right. Several attempts to get infant to sustain latch. A #24 nipple shield was used. Observe flow of milk when infant released breast. Infant has a shallow latch on this breast. Attempts made to widen infant's gape with adjusting lower jaw and rolling upper lip. Lots of teaching with mother on proper latch and good depth. She has good understanding of applying the  nipple shield and understands that infant needs supplementing after each feeding. Mother states that she feels better about tips learned today and she thinks she  will continue to try and breastfeed infant.  Suggested that she follow up with LC in one week.   Father states that [facility] .  advised to follow up with LC if needed more assistance with breastfeeding.   Infant's oral assessment:  WNL  Positioning:  Football left breast  LATCH documentation:  Latch:  2 = Grasps breast easily, tongue down, lips flanged, rhythmical sucking.  Audible swallowing:  1 = A few with stimulation  Type of nipple:  0 = Inverted  Comfort (Breast/Nipple):  1 = Filling, red/small blisters or bruises, mild/mod discomfort  Hold (Positioning):  1 = Assistance needed to correctly position infant at breast and maintain latch  LATCH score:  5  Attached assessment:  Shallow  Lips flanged:  Yes.    Lips untucked:  Yes.    Suck assessment:  Nutritive and Nonnutritive  Tools:  Comfort gels, #24 nipple shield Instructed on use and cleaning of tool:  yes  Pre-feed weight:  2996 Post-feed weight:  3008 Amount transferred:  12 ml  Amount supplemented:  10 ml given with a curved tip syringe at the breast. Infant became very tired and released the breast.   Total amount transferred:  20 ml  Total supplement given:  60 ml expressed breast milk with a bottle  Mother advised to begin breastfeeding infant again using a #24 nipple shield Breastfeed infant 8-12 times in 24 hours Supplement infant with EBM after each feeding, give infant at least 60-70 ml every 2-3 hours Mother to continue to pump breast after feedings for 15-20 mins Advised mother to follow up with Peds for weight check( father [facility]) [facility], and a  PP visit for Mother within the week to discuss emotional feelings Interpreter Elane FritzBlanca informed mother of appts and read plan to her. Mother also states that she can read my written plan PRN Lactation services if need assistance with  breastfeeding

## 2015-04-02 ENCOUNTER — Ambulatory Visit (HOSPITAL_COMMUNITY): Payer: MEDICAID
